# Patient Record
Sex: Female | Born: 1935 | Race: White | Hispanic: No | Marital: Married | Smoking: Never smoker
Health system: Southern US, Community
[De-identification: ages and names within clinical notes are randomized; demographics above are authoritative.]

---

## 1983-12-31 HISTORY — PX: BREAST EXCISIONAL BIOPSY: SUR124

## 1990-12-30 HISTORY — PX: BREAST CYST ASPIRATION: SHX578

## 1998-09-12 ENCOUNTER — Observation Stay (HOSPITAL_COMMUNITY): Admission: RE | Admit: 1998-09-12 | Discharge: 1998-09-13 | Payer: Self-pay | Admitting: Surgery

## 2005-09-05 ENCOUNTER — Ambulatory Visit: Payer: Self-pay | Admitting: Internal Medicine

## 2006-03-13 ENCOUNTER — Ambulatory Visit: Payer: Self-pay | Admitting: Internal Medicine

## 2006-04-25 ENCOUNTER — Ambulatory Visit: Payer: Self-pay | Admitting: Unknown Physician Specialty

## 2006-09-12 ENCOUNTER — Ambulatory Visit: Payer: Self-pay | Admitting: Internal Medicine

## 2006-11-17 ENCOUNTER — Ambulatory Visit: Payer: Self-pay | Admitting: Unknown Physician Specialty

## 2007-01-21 ENCOUNTER — Ambulatory Visit: Payer: Self-pay | Admitting: Internal Medicine

## 2007-09-15 ENCOUNTER — Ambulatory Visit: Payer: Self-pay | Admitting: Internal Medicine

## 2007-12-02 ENCOUNTER — Ambulatory Visit: Payer: Self-pay | Admitting: Unknown Physician Specialty

## 2008-05-09 ENCOUNTER — Other Ambulatory Visit: Payer: Self-pay

## 2008-05-09 ENCOUNTER — Inpatient Hospital Stay: Payer: Self-pay | Admitting: Internal Medicine

## 2008-07-29 ENCOUNTER — Ambulatory Visit: Payer: Self-pay | Admitting: Internal Medicine

## 2008-09-16 ENCOUNTER — Ambulatory Visit: Payer: Self-pay | Admitting: Internal Medicine

## 2009-09-19 ENCOUNTER — Ambulatory Visit: Payer: Self-pay | Admitting: Internal Medicine

## 2010-09-20 ENCOUNTER — Ambulatory Visit: Payer: Self-pay | Admitting: Internal Medicine

## 2011-03-04 ENCOUNTER — Ambulatory Visit: Payer: Self-pay | Admitting: Internal Medicine

## 2011-03-21 ENCOUNTER — Ambulatory Visit: Payer: Self-pay | Admitting: Pain Medicine

## 2011-04-01 ENCOUNTER — Ambulatory Visit: Payer: Self-pay | Admitting: Pain Medicine

## 2011-04-09 ENCOUNTER — Ambulatory Visit: Payer: Self-pay | Admitting: Pain Medicine

## 2011-04-30 ENCOUNTER — Ambulatory Visit: Payer: Self-pay | Admitting: Pain Medicine

## 2011-05-02 ENCOUNTER — Ambulatory Visit: Payer: Self-pay | Admitting: Pain Medicine

## 2011-05-15 ENCOUNTER — Ambulatory Visit: Payer: Self-pay | Admitting: Pain Medicine

## 2011-11-04 ENCOUNTER — Ambulatory Visit: Payer: Self-pay | Admitting: Internal Medicine

## 2012-04-27 IMAGING — MG MM CAD SCREENING MAMMO
1 series · 4 of 4 positions shown · non-contrast
Comparison: 06/25/2002, 06/30/2003, 07/31/2004, 09/15/2007 and 09/19/2009.

REASON FOR EXAM: SCR
COMMENTS:

PROCEDURE:     MAM - MAM DGTL SCREENING MAMMO W/CAD  - September 20, 2010  [DATE]
RESULT:

[Series 7539: R CC · right · 4 of 4 slices shown]
[im 1/4]
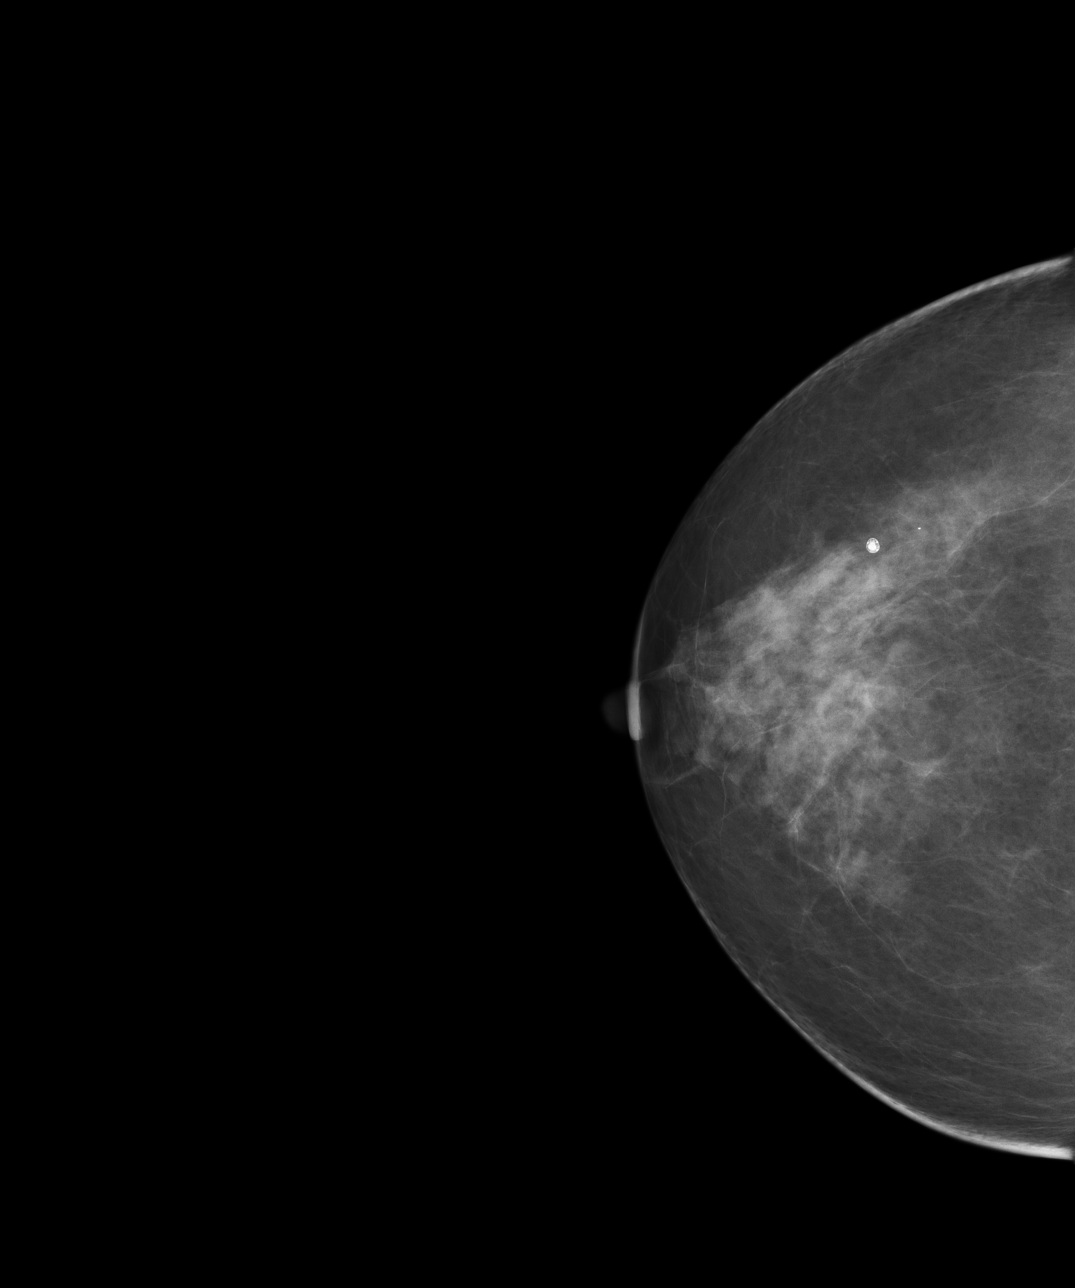
[im 2/4]
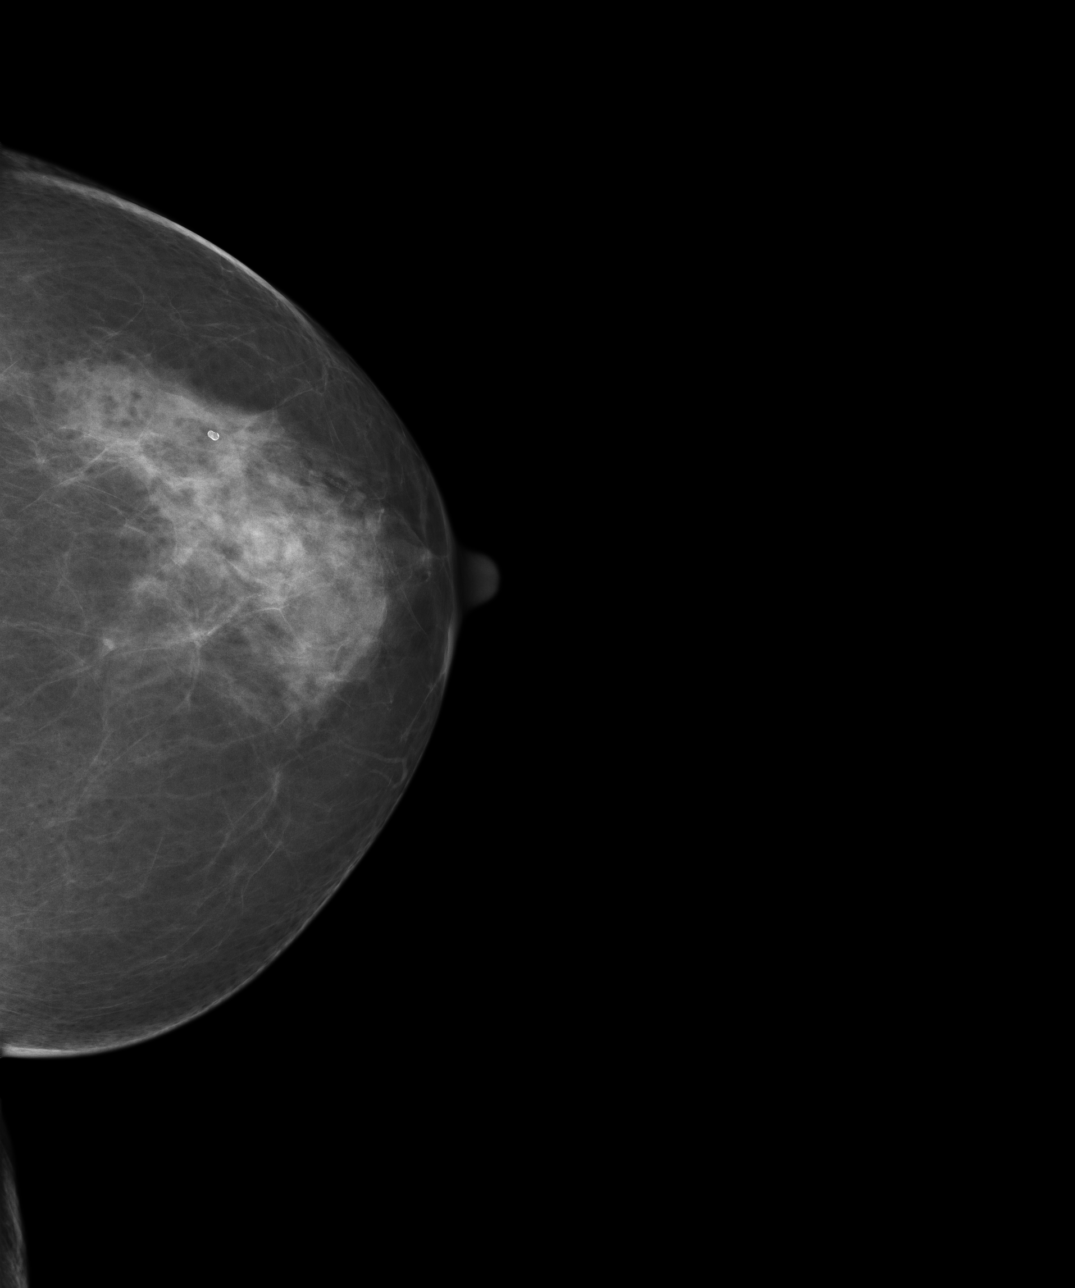
[im 3/4]
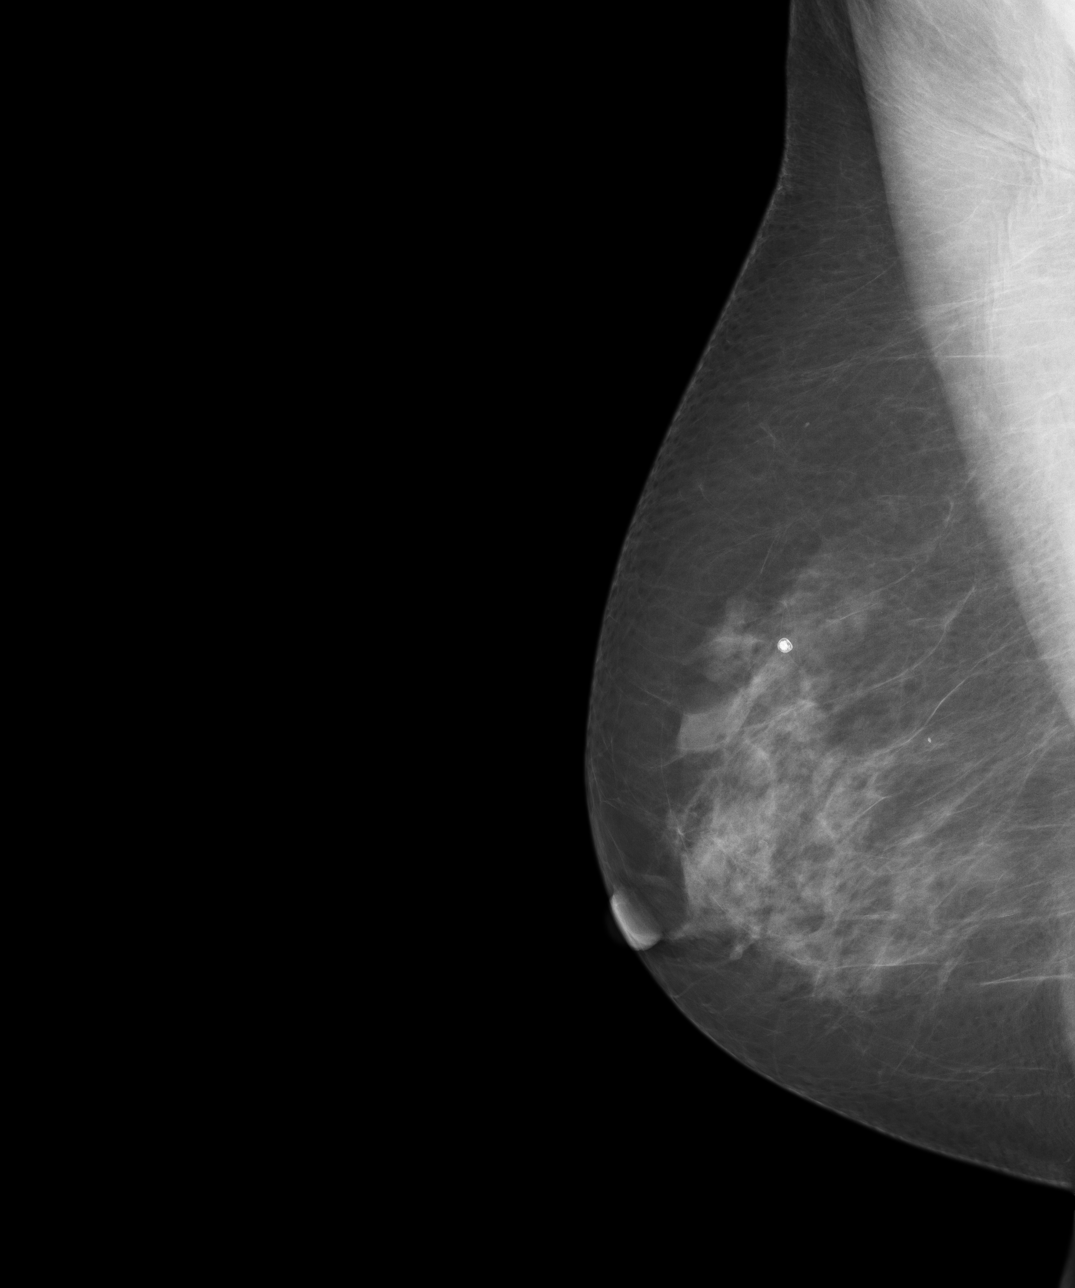
[im 4/4]
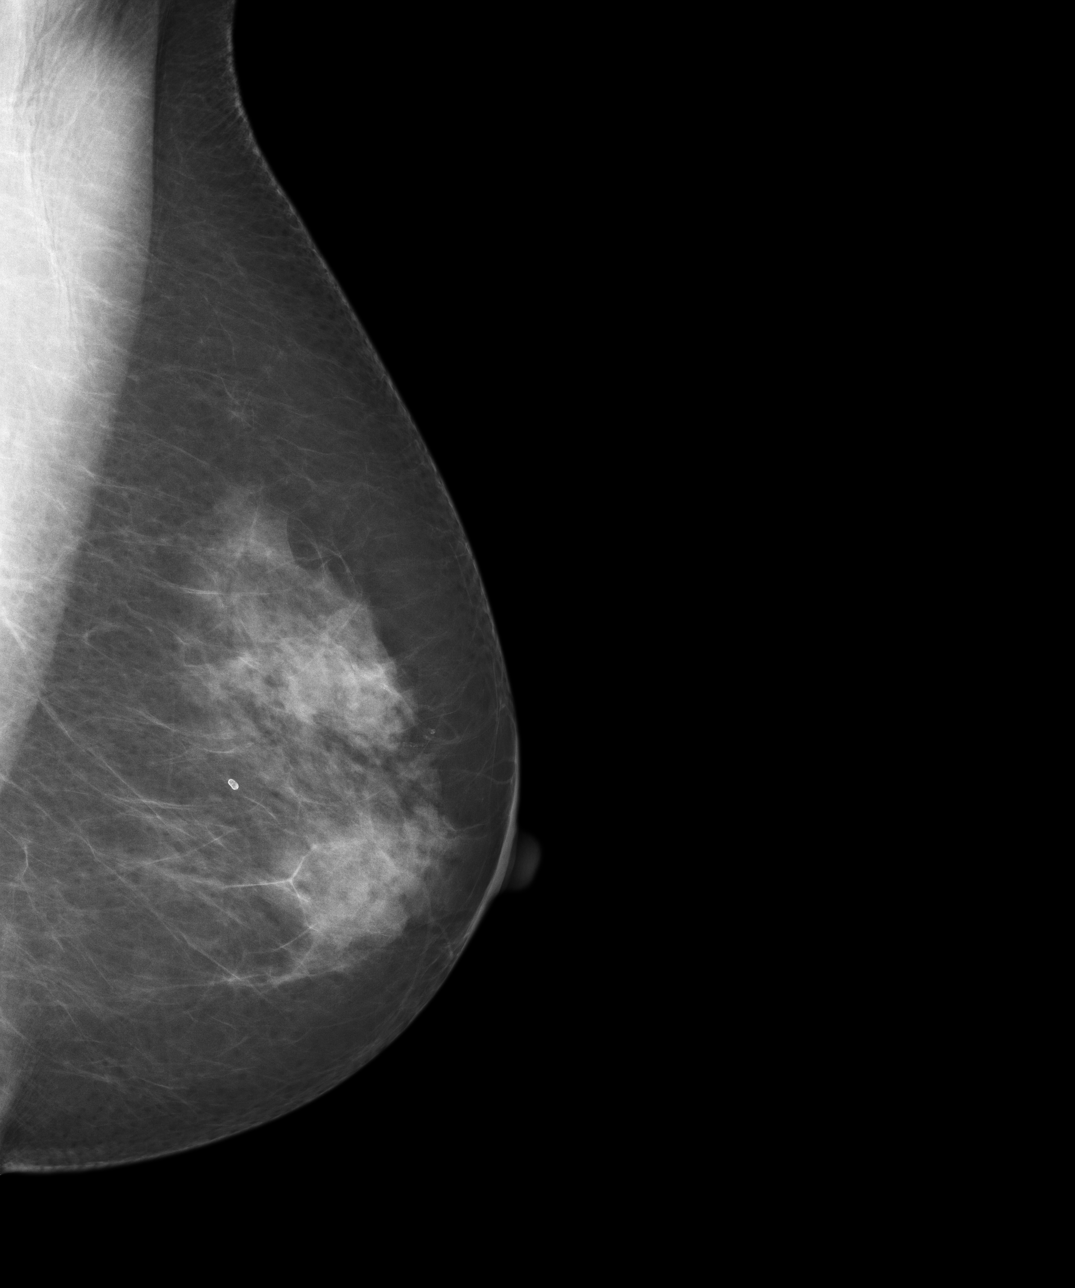

[4 of 4 positions shown; findings below may reference images not displayed]

FINDINGS: Bilateral breasts demonstrate a heterogeneous breast density which
may lower the sensitivity of mammography. There is no new dominant mass,
architectural distortion or clusters of suspicious microcalcifications.
IMPRESSION: 1.      Stable bilateral mammogram.
2.      Annual mammographic follow-up recommended.

BI-RADS: Category 1 - Negative

Thank you for this opportunity to contribute to the care of your patient.

A NEGATIVE MAMMOGRAM REPORT DOES NOT PRECLUDE BIOPSY OR OTHER EVALUATION OF
A CLINICALLY PALPABLE OR OTHERWISE SUSPICIOUS MASS OR LESION. BREAST CANCER
MAY NOT BE DETECTED BY MAMMOGRAPHY IN UP TO 10% OF CASES.

## 2012-11-04 ENCOUNTER — Ambulatory Visit: Payer: Self-pay | Admitting: Internal Medicine

## 2013-11-05 ENCOUNTER — Ambulatory Visit: Payer: Self-pay | Admitting: Internal Medicine

## 2014-11-08 ENCOUNTER — Ambulatory Visit: Payer: Self-pay | Admitting: Internal Medicine

## 2014-11-14 ENCOUNTER — Ambulatory Visit: Payer: Self-pay | Admitting: Unknown Physician Specialty

## 2015-03-12 ENCOUNTER — Emergency Department: Payer: Self-pay | Admitting: Emergency Medicine

## 2015-04-24 LAB — SURGICAL PATHOLOGY

## 2015-09-14 ENCOUNTER — Other Ambulatory Visit: Payer: Self-pay | Admitting: Internal Medicine

## 2015-09-14 DIAGNOSIS — Z1231 Encounter for screening mammogram for malignant neoplasm of breast: Secondary | ICD-10-CM

## 2015-09-14 DIAGNOSIS — E039 Hypothyroidism, unspecified: Secondary | ICD-10-CM | POA: Insufficient documentation

## 2015-09-14 DIAGNOSIS — I38 Endocarditis, valve unspecified: Secondary | ICD-10-CM | POA: Insufficient documentation

## 2015-11-13 ENCOUNTER — Ambulatory Visit
Admission: RE | Admit: 2015-11-13 | Discharge: 2015-11-13 | Disposition: A | Discharge status: Home / Self Care | Payer: Commercial Managed Care - HMO | Source: Ambulatory Visit | Attending: Internal Medicine | Admitting: Internal Medicine

## 2015-11-13 ENCOUNTER — Other Ambulatory Visit: Payer: Self-pay | Admitting: Internal Medicine

## 2015-11-13 DIAGNOSIS — Z1231 Encounter for screening mammogram for malignant neoplasm of breast: Secondary | ICD-10-CM

## 2016-03-06 DIAGNOSIS — Z0000 Encounter for general adult medical examination without abnormal findings: Secondary | ICD-10-CM | POA: Diagnosis not present

## 2016-03-06 DIAGNOSIS — E039 Hypothyroidism, unspecified: Secondary | ICD-10-CM | POA: Diagnosis not present

## 2016-03-07 DIAGNOSIS — M810 Age-related osteoporosis without current pathological fracture: Secondary | ICD-10-CM | POA: Diagnosis not present

## 2016-03-12 DIAGNOSIS — E039 Hypothyroidism, unspecified: Secondary | ICD-10-CM | POA: Diagnosis not present

## 2016-03-12 DIAGNOSIS — Z79899 Other long term (current) drug therapy: Secondary | ICD-10-CM | POA: Diagnosis not present

## 2016-03-12 DIAGNOSIS — M810 Age-related osteoporosis without current pathological fracture: Secondary | ICD-10-CM | POA: Diagnosis not present

## 2016-03-12 DIAGNOSIS — I255 Ischemic cardiomyopathy: Secondary | ICD-10-CM | POA: Diagnosis not present

## 2016-04-08 DIAGNOSIS — M810 Age-related osteoporosis without current pathological fracture: Secondary | ICD-10-CM | POA: Diagnosis not present

## 2016-09-09 DIAGNOSIS — I255 Ischemic cardiomyopathy: Secondary | ICD-10-CM | POA: Diagnosis not present

## 2016-09-09 DIAGNOSIS — E039 Hypothyroidism, unspecified: Secondary | ICD-10-CM | POA: Diagnosis not present

## 2016-09-09 DIAGNOSIS — Z79899 Other long term (current) drug therapy: Secondary | ICD-10-CM | POA: Diagnosis not present

## 2016-09-18 DIAGNOSIS — Z0000 Encounter for general adult medical examination without abnormal findings: Secondary | ICD-10-CM | POA: Diagnosis not present

## 2016-09-18 DIAGNOSIS — I447 Left bundle-branch block, unspecified: Secondary | ICD-10-CM | POA: Insufficient documentation

## 2016-09-18 DIAGNOSIS — Z23 Encounter for immunization: Secondary | ICD-10-CM | POA: Diagnosis not present

## 2016-09-18 DIAGNOSIS — E538 Deficiency of other specified B group vitamins: Secondary | ICD-10-CM | POA: Diagnosis not present

## 2016-09-18 DIAGNOSIS — I38 Endocarditis, valve unspecified: Secondary | ICD-10-CM | POA: Diagnosis not present

## 2016-09-25 DIAGNOSIS — I38 Endocarditis, valve unspecified: Secondary | ICD-10-CM | POA: Diagnosis not present

## 2016-10-07 ENCOUNTER — Other Ambulatory Visit: Payer: Self-pay | Admitting: Internal Medicine

## 2016-10-07 DIAGNOSIS — Z1231 Encounter for screening mammogram for malignant neoplasm of breast: Secondary | ICD-10-CM

## 2016-11-13 ENCOUNTER — Ambulatory Visit
Admission: RE | Admit: 2016-11-13 | Discharge: 2016-11-13 | Disposition: A | Discharge status: Home / Self Care | Payer: PPO | Source: Ambulatory Visit | Attending: Internal Medicine | Admitting: Internal Medicine

## 2016-11-13 DIAGNOSIS — Z1231 Encounter for screening mammogram for malignant neoplasm of breast: Secondary | ICD-10-CM | POA: Diagnosis not present

## 2017-03-11 DIAGNOSIS — Z0000 Encounter for general adult medical examination without abnormal findings: Secondary | ICD-10-CM | POA: Diagnosis not present

## 2017-03-11 DIAGNOSIS — E538 Deficiency of other specified B group vitamins: Secondary | ICD-10-CM | POA: Diagnosis not present

## 2017-03-25 DIAGNOSIS — E538 Deficiency of other specified B group vitamins: Secondary | ICD-10-CM | POA: Diagnosis not present

## 2017-03-25 DIAGNOSIS — I420 Dilated cardiomyopathy: Secondary | ICD-10-CM | POA: Diagnosis not present

## 2017-03-25 DIAGNOSIS — M810 Age-related osteoporosis without current pathological fracture: Secondary | ICD-10-CM | POA: Diagnosis not present

## 2017-03-25 DIAGNOSIS — E039 Hypothyroidism, unspecified: Secondary | ICD-10-CM | POA: Diagnosis not present

## 2017-03-25 DIAGNOSIS — Z0000 Encounter for general adult medical examination without abnormal findings: Secondary | ICD-10-CM | POA: Diagnosis not present

## 2017-04-14 DIAGNOSIS — M810 Age-related osteoporosis without current pathological fracture: Secondary | ICD-10-CM | POA: Diagnosis not present

## 2017-09-05 DIAGNOSIS — H2513 Age-related nuclear cataract, bilateral: Secondary | ICD-10-CM | POA: Diagnosis not present

## 2017-09-18 DIAGNOSIS — M810 Age-related osteoporosis without current pathological fracture: Secondary | ICD-10-CM | POA: Diagnosis not present

## 2017-09-18 DIAGNOSIS — I420 Dilated cardiomyopathy: Secondary | ICD-10-CM | POA: Diagnosis not present

## 2017-09-18 DIAGNOSIS — E538 Deficiency of other specified B group vitamins: Secondary | ICD-10-CM | POA: Diagnosis not present

## 2017-09-18 DIAGNOSIS — I10 Essential (primary) hypertension: Secondary | ICD-10-CM | POA: Diagnosis not present

## 2017-09-18 DIAGNOSIS — E039 Hypothyroidism, unspecified: Secondary | ICD-10-CM | POA: Diagnosis not present

## 2017-09-19 DIAGNOSIS — Z23 Encounter for immunization: Secondary | ICD-10-CM | POA: Diagnosis not present

## 2017-09-25 ENCOUNTER — Ambulatory Visit
Admission: RE | Admit: 2017-09-25 | Discharge: 2017-09-25 | Disposition: A | Discharge status: Home / Self Care | Payer: PPO | Source: Ambulatory Visit | Attending: Internal Medicine | Admitting: Internal Medicine

## 2017-09-25 ENCOUNTER — Other Ambulatory Visit: Payer: Self-pay | Admitting: Internal Medicine

## 2017-09-25 DIAGNOSIS — R1084 Generalized abdominal pain: Secondary | ICD-10-CM

## 2017-09-25 DIAGNOSIS — D259 Leiomyoma of uterus, unspecified: Secondary | ICD-10-CM | POA: Diagnosis not present

## 2017-09-25 DIAGNOSIS — K5730 Diverticulosis of large intestine without perforation or abscess without bleeding: Secondary | ICD-10-CM | POA: Diagnosis not present

## 2017-09-25 DIAGNOSIS — M810 Age-related osteoporosis without current pathological fracture: Secondary | ICD-10-CM | POA: Diagnosis not present

## 2017-09-25 DIAGNOSIS — Z0000 Encounter for general adult medical examination without abnormal findings: Secondary | ICD-10-CM | POA: Diagnosis not present

## 2017-09-25 DIAGNOSIS — N1330 Unspecified hydronephrosis: Secondary | ICD-10-CM | POA: Diagnosis not present

## 2017-09-25 DIAGNOSIS — I2510 Atherosclerotic heart disease of native coronary artery without angina pectoris: Secondary | ICD-10-CM | POA: Diagnosis not present

## 2017-09-25 DIAGNOSIS — E538 Deficiency of other specified B group vitamins: Secondary | ICD-10-CM | POA: Diagnosis not present

## 2017-09-25 DIAGNOSIS — E039 Hypothyroidism, unspecified: Secondary | ICD-10-CM | POA: Diagnosis not present

## 2017-09-25 MED ORDER — IOPAMIDOL (ISOVUE-300) INJECTION 61%
100.0000 mL | Freq: Once | INTRAVENOUS | Status: AC | PRN
  Administered 2017-09-25: 100 mL via INTRAVENOUS

## 2017-10-02 ENCOUNTER — Other Ambulatory Visit: Payer: Self-pay | Admitting: Internal Medicine

## 2017-10-02 DIAGNOSIS — Z1231 Encounter for screening mammogram for malignant neoplasm of breast: Secondary | ICD-10-CM

## 2017-10-06 DIAGNOSIS — M810 Age-related osteoporosis without current pathological fracture: Secondary | ICD-10-CM | POA: Diagnosis not present

## 2017-10-21 DIAGNOSIS — M4180 Other forms of scoliosis, site unspecified: Secondary | ICD-10-CM | POA: Insufficient documentation

## 2017-10-21 DIAGNOSIS — N1330 Unspecified hydronephrosis: Secondary | ICD-10-CM | POA: Diagnosis not present

## 2017-10-21 DIAGNOSIS — E039 Hypothyroidism, unspecified: Secondary | ICD-10-CM | POA: Diagnosis not present

## 2017-10-21 DIAGNOSIS — N816 Rectocele: Secondary | ICD-10-CM | POA: Diagnosis not present

## 2017-10-30 ENCOUNTER — Ambulatory Visit (INDEPENDENT_AMBULATORY_CARE_PROVIDER_SITE_OTHER): Payer: PPO | Admitting: Urology

## 2017-10-30 ENCOUNTER — Encounter: Payer: Self-pay | Admitting: Urology

## 2017-10-30 VITALS — BP 127/68 | HR 86 | Ht 61.0 in | Wt 101.0 lb

## 2017-10-30 DIAGNOSIS — Z86010 Personal history of colonic polyps: Secondary | ICD-10-CM | POA: Insufficient documentation

## 2017-10-30 DIAGNOSIS — N1330 Unspecified hydronephrosis: Secondary | ICD-10-CM

## 2017-10-30 DIAGNOSIS — K8020 Calculus of gallbladder without cholecystitis without obstruction: Secondary | ICD-10-CM | POA: Insufficient documentation

## 2017-10-30 DIAGNOSIS — M542 Cervicalgia: Secondary | ICD-10-CM | POA: Insufficient documentation

## 2017-10-30 DIAGNOSIS — I2510 Atherosclerotic heart disease of native coronary artery without angina pectoris: Secondary | ICD-10-CM | POA: Insufficient documentation

## 2017-10-30 DIAGNOSIS — I10 Essential (primary) hypertension: Secondary | ICD-10-CM | POA: Insufficient documentation

## 2017-10-30 DIAGNOSIS — I429 Cardiomyopathy, unspecified: Secondary | ICD-10-CM | POA: Insufficient documentation

## 2017-10-30 LAB — MICROSCOPIC EXAMINATION
BACTERIA UA: NONE SEEN
EPITHELIAL CELLS (NON RENAL): NONE SEEN /HPF (ref 0–10)
WBC, UA: NONE SEEN /hpf (ref 0–?)

## 2017-10-30 LAB — URINALYSIS, COMPLETE
BILIRUBIN UA: NEGATIVE
Glucose, UA: NEGATIVE
Ketones, UA: NEGATIVE
LEUKOCYTES UA: NEGATIVE
Nitrite, UA: NEGATIVE
PH UA: 7 (ref 5.0–7.5)
PROTEIN UA: NEGATIVE
Specific Gravity, UA: <1.005 — ABNORMAL LOW (ref 1.005–1.030)
Urobilinogen, Ur: 0.2 mg/dL (ref 0.2–1.0)

## 2017-10-30 NOTE — Progress Notes (Signed)
10/30/2017 7:15 PM   Mary Landry [birth year] [id number]  Referring provider: Rusty Aus, MD [facility] [place], [place] [address]  Chief Complaint  Patient presents with  . Hydronephrosis    New Patient    HPI: Mary Landry is a 81 year old female seen in consultation at the request of Dr. Sabra Heck for evaluation of right hydronephrosis.  She saw Dr. Sabra Heck in September 2018 complaining of a left paraspinal mass with pain/tenderness.  A CT of the abdomen and pelvis was performed which showed moderate right hydronephrosis fell consistent with a UPJ obstruction.  A CT scan in 2016 did show hydronephrosis which was slightly less and this was felt secondary to a recently passed stone that was in the bladder.  A renal ultrasound in 2009 showed no hydronephrosis.  She denies right flank or abdominal pain.  She has no voiding symptoms and specifically denies urinary frequency, urgency, dysuria, incontinence or gross hematuria.  Denies previous history of urologic problems.   PMH: No past medical history on file.  Surgical History: Past Surgical History:  Procedure Laterality Date  . BREAST CYST ASPIRATION Right 1992  . BREAST EXCISIONAL BIOPSY Left 1985   benign    Home Medications:  Allergies as of 10/30/2017   No Known Allergies     Medication List       Accurate as of 10/30/17  7:15 PM. Always use your most recent med list.          aspirin EC 81 MG tablet Take by mouth.   levothyroxine 75 MCG tablet Commonly known as:  SYNTHROID, LEVOTHROID take 1 tablet by mouth every morning ON AN EMPTY STOMACH WITH A G...  (REFER TO PRESCRIPTION NOTES).   lisinopril 20 MG tablet Commonly known as:  PRINIVIL,ZESTRIL Take 10 mg by mouth daily.   metoprolol succinate 50 MG 24 hr tablet Commonly known as:  TOPROL-XL Take 25 mg by mouth daily.   omeprazole 20 MG capsule Commonly known as:  PRILOSEC Take 20 mg by mouth daily.     simvastatin 20 MG tablet Commonly known as:  ZOCOR Take 20 mg by mouth at bedtime.       Allergies: No Known Allergies  Family History: No family history on file.  Social History:  reports that she has never smoked. She has never used smokeless tobacco. She reports that she does not drink alcohol or use drugs.  ROS: UROLOGY Frequent Urination?: No Hard to postpone urination?: No Burning/pain with urination?: No Get up at night to urinate?: No Leakage of urine?: No Urine stream starts and stops?: No Trouble starting stream?: No Do you have to strain to urinate?: No Blood in urine?: No Urinary tract infection?: No Sexually transmitted disease?: No Injury to kidneys or bladder?: No Painful intercourse?: No Weak stream?: No Currently pregnant?: No Vaginal bleeding?: No Last menstrual period?: n  Gastrointestinal Nausea?: No Vomiting?: No Indigestion/heartburn?: No Diarrhea?: No Constipation?: No  Constitutional Fever: No Night sweats?: No Weight loss?: No Fatigue?: Yes  Skin Skin rash/lesions?: No Itching?: No  Eyes Blurred vision?: No Double vision?: No  Ears/Nose/Throat Sore throat?: No Sinus problems?: No  Hematologic/Lymphatic Swollen glands?: No Easy bruising?: Yes  Cardiovascular Leg swelling?: No Chest pain?: No  Respiratory Cough?: No Shortness of breath?: No  Endocrine Excessive thirst?: No  Musculoskeletal Back pain?: Yes Joint pain?: No  Neurological Headaches?: No Dizziness?: No  Psychologic Depression?: No Anxiety?: No  Physical Exam: BP 127/68   Pulse  86   Ht 5\' 1"  (1.549 m)   Wt 101 lb (45.8 kg)   BMI 19.08 kg/m   Constitutional:  Alert and oriented, No acute distress. HEENT: [place] AT, moist mucus membranes.  Trachea midline, no masses. Cardiovascular: No clubbing, cyanosis, or edema. Respiratory: Normal respiratory effort, no increased work of breathing. GI: Abdomen is soft, nontender, nondistended, no  abdominal masses GU: No CVA tenderness.  Skin: No rashes, bruises or suspicious lesions. Lymph: No cervical or inguinal adenopathy. Neurologic: Grossly intact, no focal deficits, moving all 4 extremities. Psychiatric: Normal mood and affect.  Laboratory Data:  Urinalysis Lab Results  Component Value Date   SPECGRAV <1.005 (L) 10/30/2017   PHUR 7.0 10/30/2017   COLORU Yellow 10/30/2017   APPEARANCEUR Clear 10/30/2017   LEUKOCYTESUR Negative 10/30/2017   PROTEINUR Negative 10/30/2017   GLUCOSEU Negative 10/30/2017   KETONESU Negative 10/30/2017   RBCU Trace (A) 10/30/2017   BILIRUBINUR Negative 10/30/2017   UUROB 0.2 10/30/2017   NITRITE Negative 10/30/2017    Lab Results  Component Value Date   LABMICR See below: 10/30/2017   WBCUA None seen 10/30/2017   RBCUA 0-2 10/30/2017   LABEPIT None seen 10/30/2017   BACTERIA None seen 10/30/2017    Pertinent Imaging: CT of 09/25/2017 was personally reviewed.  Doubt there is significant hydronephrosis as contrast is present in the renal pelvis and ureter within 10 minutes of contrast injection.  Assessment & Plan:    1. Hydronephrosis, unspecified hydronephrosis type The clinical entity of hydronephrosis was discussed including both obstructive and nonobstructive hydronephrosis.  She does have bilateral extra renal pelves.  Have recommended scheduling a diuretic renogram for further evaluation.  - Urinalysis, Complete - NM Renal Imaging Flow W/Pharm; Future    Abbie Sons, [place] [address], [place], [place] [address] [phone]

## 2017-11-14 ENCOUNTER — Ambulatory Visit
Admission: RE | Admit: 2017-11-14 | Discharge: 2017-11-14 | Disposition: A | Discharge status: Home / Self Care | Payer: PPO | Source: Ambulatory Visit | Attending: Internal Medicine | Admitting: Internal Medicine

## 2017-11-14 DIAGNOSIS — Z1231 Encounter for screening mammogram for malignant neoplasm of breast: Secondary | ICD-10-CM | POA: Diagnosis not present

## 2017-11-24 ENCOUNTER — Encounter
Admission: RE | Admit: 2017-11-24 | Discharge: 2017-11-24 | Disposition: A | Discharge status: Home / Self Care | Payer: PPO | Source: Ambulatory Visit | Attending: Urology | Admitting: Urology

## 2017-11-24 DIAGNOSIS — N1330 Unspecified hydronephrosis: Secondary | ICD-10-CM | POA: Diagnosis not present

## 2017-11-24 MED ORDER — TECHNETIUM TC 99M MERTIATIDE
5.0000 | Freq: Once | INTRAVENOUS | Status: AC | PRN
  Administered 2017-11-24: 5.37 via INTRAVENOUS

## 2017-11-24 MED ORDER — FUROSEMIDE 10 MG/ML IJ SOLN
20.0000 mg | Freq: Once | INTRAMUSCULAR | Status: AC
  Administered 2017-11-24: 20 mg via INTRAVENOUS
  Filled 2017-11-24: qty 2

## 2017-11-25 ENCOUNTER — Telehealth: Payer: Self-pay

## 2017-11-25 NOTE — Telephone Encounter (Signed)
Will send a letter

## 2017-11-25 NOTE — Telephone Encounter (Signed)
-----   Message from Abbie Sons, MD sent at 11/25/2017  7:26 AM EST ----- Renal scan showed no evidence of obstruction.  No further evaluation needed.

## 2017-11-27 ENCOUNTER — Ambulatory Visit (INDEPENDENT_AMBULATORY_CARE_PROVIDER_SITE_OTHER): Payer: PPO | Admitting: Urology

## 2017-11-27 ENCOUNTER — Encounter: Payer: Self-pay | Admitting: Urology

## 2017-11-27 VITALS — BP 109/68 | HR 85 | Ht 61.0 in | Wt 105.0 lb

## 2017-11-27 DIAGNOSIS — N1339 Other hydronephrosis: Secondary | ICD-10-CM

## 2017-11-27 NOTE — Progress Notes (Signed)
11/27/2017 1:45 PM   Marc Morgans Passow Jul 01, 1936 [id number]  Referring provider: Rusty Aus, MD [facility] [place], [place] [address]  Chief Complaint  Patient presents with  . Hydronephrosis    scan results    HPI: 81 year old female presents for follow-up.  She was felt to have moderate right hydronephrosis secondary to UPJ obstruction on a recent CT.  Refer to my previous office note of 10/30/2017.  She has bilateral low back pain but no flank pain.    The renal scan showed normal differential function at 48% and 52%.  T1/2 were normal and there was no evidences of obstruction.   PMH: No past medical history on file.  Surgical History: Past Surgical History:  Procedure Laterality Date  . BREAST CYST ASPIRATION Right 1992  . BREAST EXCISIONAL BIOPSY Left 1985   benign    Home Medications:  Allergies as of 11/27/2017   No Known Allergies     Medication List        Accurate as of 11/27/17  1:45 PM. Always use your most recent med list.          aspirin EC 81 MG tablet Take by mouth.   levothyroxine 75 MCG tablet Commonly known as:  SYNTHROID, LEVOTHROID take 1 tablet by mouth every morning ON AN EMPTY STOMACH WITH A G...  (REFER TO PRESCRIPTION NOTES).   lisinopril 20 MG tablet Commonly known as:  PRINIVIL,ZESTRIL Take 10 mg by mouth daily.   metoprolol succinate 50 MG 24 hr tablet Commonly known as:  TOPROL-XL Take 25 mg by mouth daily.   omeprazole 20 MG capsule Commonly known as:  PRILOSEC Take 20 mg by mouth daily.   simvastatin 20 MG tablet Commonly known as:  ZOCOR Take 20 mg by mouth at bedtime.       Allergies: No Known Allergies  Family History: No family history on file.  Social History:  reports that  has never smoked. she has never used smokeless tobacco. She reports that she does not drink alcohol or use drugs.  ROS: UROLOGY Frequent Urination?: No Hard to postpone  urination?: No Burning/pain with urination?: No Get up at night to urinate?: No Leakage of urine?: No Urine stream starts and stops?: No Trouble starting stream?: No Do you have to strain to urinate?: No Blood in urine?: No Urinary tract infection?: No Sexually transmitted disease?: No Injury to kidneys or bladder?: No Painful intercourse?: No Weak stream?: No Currently pregnant?: No Vaginal bleeding?: No Last menstrual period?: n  Gastrointestinal Nausea?: No Vomiting?: No Indigestion/heartburn?: Yes Diarrhea?: No Constipation?: No  Constitutional Fever: No Night sweats?: No Weight loss?: No Fatigue?: No  Skin Skin rash/lesions?: No Itching?: No  Eyes Blurred vision?: No Double vision?: No  Ears/Nose/Throat Sore throat?: No Sinus problems?: No  Hematologic/Lymphatic Swollen glands?: No Easy bruising?: No  Cardiovascular Leg swelling?: No Chest pain?: No  Respiratory Cough?: No Shortness of breath?: No  Endocrine Excessive thirst?: No  Musculoskeletal Back pain?: Yes Joint pain?: No  Neurological Headaches?: No Dizziness?: No  Psychologic Depression?: No Anxiety?: No  Physical Exam: BP 109/68   Pulse 85   Ht 5\' 1"  (1.549 m)   Wt 105 lb (47.6 kg)   BMI 19.84 kg/m   Constitutional:  Alert and oriented, No acute distress. HEENT: [place] AT, moist mucus membranes.  Trachea midline, no masses. Cardiovascular: No clubbing, cyanosis, or edema. Respiratory: Normal respiratory effort, no increased work of breathing. GI: Abdomen is  soft, nontender, nondistended, no abdominal masses GU: No CVA tenderness. Skin: No rashes, bruises or suspicious lesions. Lymph: No cervical or inguinal adenopathy. Neurologic: Grossly intact, no focal deficits, moving all 4 extremities. Psychiatric: Normal mood and affect.  Laboratory Data:  Urinalysis Lab Results  Component Value Date   SPECGRAV <1.005 (L) 10/30/2017   PHUR 7.0 10/30/2017   COLORU Yellow  10/30/2017   APPEARANCEUR Clear 10/30/2017   LEUKOCYTESUR Negative 10/30/2017   PROTEINUR Negative 10/30/2017   GLUCOSEU Negative 10/30/2017   KETONESU Negative 10/30/2017   RBCU Trace (A) 10/30/2017   BILIRUBINUR Negative 10/30/2017   UUROB 0.2 10/30/2017   NITRITE Negative 10/30/2017    Lab Results  Component Value Date   LABMICR See below: 10/30/2017   WBCUA None seen 10/30/2017   RBCUA 0-2 10/30/2017   LABEPIT None seen 10/30/2017   BACTERIA None seen 10/30/2017    Pertinent Imaging: Renal scan reviewed  Assessment & Plan:   No evidence of obstruction.  Her back pain is most likely musculoskeletal in etiology and would consider an orthopedic referral.  She was instructed to call for development of the right flank pain.   Return if symptoms worsen or fail to improve.    Abbie Sons, [place] [address] [place], [place] [address] [phone]

## 2018-03-19 DIAGNOSIS — E039 Hypothyroidism, unspecified: Secondary | ICD-10-CM | POA: Diagnosis not present

## 2018-03-19 DIAGNOSIS — E538 Deficiency of other specified B group vitamins: Secondary | ICD-10-CM | POA: Diagnosis not present

## 2018-03-19 DIAGNOSIS — I2510 Atherosclerotic heart disease of native coronary artery without angina pectoris: Secondary | ICD-10-CM | POA: Diagnosis not present

## 2018-03-27 DIAGNOSIS — M810 Age-related osteoporosis without current pathological fracture: Secondary | ICD-10-CM | POA: Diagnosis not present

## 2018-03-27 DIAGNOSIS — E538 Deficiency of other specified B group vitamins: Secondary | ICD-10-CM | POA: Diagnosis not present

## 2018-03-27 DIAGNOSIS — Z0000 Encounter for general adult medical examination without abnormal findings: Secondary | ICD-10-CM | POA: Diagnosis not present

## 2018-03-27 DIAGNOSIS — I420 Dilated cardiomyopathy: Secondary | ICD-10-CM | POA: Diagnosis not present

## 2018-03-27 DIAGNOSIS — E039 Hypothyroidism, unspecified: Secondary | ICD-10-CM | POA: Diagnosis not present

## 2018-03-27 DIAGNOSIS — E782 Mixed hyperlipidemia: Secondary | ICD-10-CM | POA: Diagnosis not present

## 2018-04-14 DIAGNOSIS — I420 Dilated cardiomyopathy: Secondary | ICD-10-CM | POA: Diagnosis not present

## 2018-06-04 DIAGNOSIS — M810 Age-related osteoporosis without current pathological fracture: Secondary | ICD-10-CM | POA: Diagnosis not present

## 2018-09-22 DIAGNOSIS — M810 Age-related osteoporosis without current pathological fracture: Secondary | ICD-10-CM | POA: Diagnosis not present

## 2018-09-22 DIAGNOSIS — I420 Dilated cardiomyopathy: Secondary | ICD-10-CM | POA: Diagnosis not present

## 2018-09-22 DIAGNOSIS — E782 Mixed hyperlipidemia: Secondary | ICD-10-CM | POA: Diagnosis not present

## 2018-09-22 DIAGNOSIS — E039 Hypothyroidism, unspecified: Secondary | ICD-10-CM | POA: Diagnosis not present

## 2018-09-22 DIAGNOSIS — E538 Deficiency of other specified B group vitamins: Secondary | ICD-10-CM | POA: Diagnosis not present

## 2018-09-29 DIAGNOSIS — E782 Mixed hyperlipidemia: Secondary | ICD-10-CM | POA: Diagnosis not present

## 2018-09-29 DIAGNOSIS — Z0000 Encounter for general adult medical examination without abnormal findings: Secondary | ICD-10-CM | POA: Diagnosis not present

## 2018-09-29 DIAGNOSIS — E039 Hypothyroidism, unspecified: Secondary | ICD-10-CM | POA: Diagnosis not present

## 2018-09-29 DIAGNOSIS — E538 Deficiency of other specified B group vitamins: Secondary | ICD-10-CM | POA: Diagnosis not present

## 2018-09-29 DIAGNOSIS — I38 Endocarditis, valve unspecified: Secondary | ICD-10-CM | POA: Diagnosis not present

## 2018-09-29 DIAGNOSIS — Z23 Encounter for immunization: Secondary | ICD-10-CM | POA: Diagnosis not present

## 2018-09-30 ENCOUNTER — Other Ambulatory Visit: Payer: Self-pay | Admitting: Internal Medicine

## 2018-09-30 DIAGNOSIS — Z1231 Encounter for screening mammogram for malignant neoplasm of breast: Secondary | ICD-10-CM

## 2018-11-16 ENCOUNTER — Ambulatory Visit
Admission: RE | Admit: 2018-11-16 | Discharge: 2018-11-16 | Disposition: A | Discharge status: Home / Self Care | Payer: PPO | Source: Ambulatory Visit | Attending: Internal Medicine | Admitting: Internal Medicine

## 2018-11-16 DIAGNOSIS — Z1231 Encounter for screening mammogram for malignant neoplasm of breast: Secondary | ICD-10-CM | POA: Insufficient documentation

## 2019-03-25 DIAGNOSIS — E782 Mixed hyperlipidemia: Secondary | ICD-10-CM | POA: Diagnosis not present

## 2019-03-25 DIAGNOSIS — E538 Deficiency of other specified B group vitamins: Secondary | ICD-10-CM | POA: Diagnosis not present

## 2019-03-25 DIAGNOSIS — E039 Hypothyroidism, unspecified: Secondary | ICD-10-CM | POA: Diagnosis not present

## 2019-04-01 DIAGNOSIS — N401 Enlarged prostate with lower urinary tract symptoms: Secondary | ICD-10-CM | POA: Diagnosis not present

## 2019-04-01 DIAGNOSIS — I420 Dilated cardiomyopathy: Secondary | ICD-10-CM | POA: Diagnosis not present

## 2019-04-01 DIAGNOSIS — R350 Frequency of micturition: Secondary | ICD-10-CM | POA: Diagnosis not present

## 2019-09-17 DIAGNOSIS — Z23 Encounter for immunization: Secondary | ICD-10-CM | POA: Diagnosis not present

## 2019-09-28 DIAGNOSIS — M810 Age-related osteoporosis without current pathological fracture: Secondary | ICD-10-CM | POA: Diagnosis not present

## 2019-09-28 DIAGNOSIS — E538 Deficiency of other specified B group vitamins: Secondary | ICD-10-CM | POA: Diagnosis not present

## 2019-09-28 DIAGNOSIS — E782 Mixed hyperlipidemia: Secondary | ICD-10-CM | POA: Diagnosis not present

## 2019-10-06 DIAGNOSIS — M810 Age-related osteoporosis without current pathological fracture: Secondary | ICD-10-CM | POA: Diagnosis not present

## 2019-10-06 DIAGNOSIS — E538 Deficiency of other specified B group vitamins: Secondary | ICD-10-CM | POA: Diagnosis not present

## 2019-10-06 DIAGNOSIS — I420 Dilated cardiomyopathy: Secondary | ICD-10-CM | POA: Diagnosis not present

## 2019-10-06 DIAGNOSIS — E782 Mixed hyperlipidemia: Secondary | ICD-10-CM | POA: Diagnosis not present

## 2019-10-06 DIAGNOSIS — Z0000 Encounter for general adult medical examination without abnormal findings: Secondary | ICD-10-CM | POA: Diagnosis not present

## 2019-10-11 ENCOUNTER — Other Ambulatory Visit: Payer: Self-pay | Admitting: Internal Medicine

## 2019-10-11 DIAGNOSIS — Z1231 Encounter for screening mammogram for malignant neoplasm of breast: Secondary | ICD-10-CM

## 2019-10-12 DIAGNOSIS — M810 Age-related osteoporosis without current pathological fracture: Secondary | ICD-10-CM | POA: Diagnosis not present

## 2019-11-18 ENCOUNTER — Ambulatory Visit
Admission: RE | Admit: 2019-11-18 | Discharge: 2019-11-18 | Disposition: A | Discharge status: Home / Self Care | Payer: PPO | Source: Ambulatory Visit | Attending: Internal Medicine | Admitting: Internal Medicine

## 2019-11-18 DIAGNOSIS — Z1231 Encounter for screening mammogram for malignant neoplasm of breast: Secondary | ICD-10-CM | POA: Diagnosis not present

## 2020-03-29 DIAGNOSIS — E538 Deficiency of other specified B group vitamins: Secondary | ICD-10-CM | POA: Diagnosis not present

## 2020-03-29 DIAGNOSIS — I420 Dilated cardiomyopathy: Secondary | ICD-10-CM | POA: Diagnosis not present

## 2020-03-29 DIAGNOSIS — E782 Mixed hyperlipidemia: Secondary | ICD-10-CM | POA: Diagnosis not present

## 2020-04-05 DIAGNOSIS — Z0000 Encounter for general adult medical examination without abnormal findings: Secondary | ICD-10-CM | POA: Diagnosis not present

## 2020-04-05 DIAGNOSIS — I420 Dilated cardiomyopathy: Secondary | ICD-10-CM | POA: Diagnosis not present

## 2020-04-05 DIAGNOSIS — M810 Age-related osteoporosis without current pathological fracture: Secondary | ICD-10-CM | POA: Diagnosis not present

## 2020-04-05 DIAGNOSIS — E538 Deficiency of other specified B group vitamins: Secondary | ICD-10-CM | POA: Diagnosis not present

## 2020-04-05 DIAGNOSIS — I38 Endocarditis, valve unspecified: Secondary | ICD-10-CM | POA: Diagnosis not present

## 2020-04-05 DIAGNOSIS — E782 Mixed hyperlipidemia: Secondary | ICD-10-CM | POA: Diagnosis not present

## 2020-04-21 DIAGNOSIS — I38 Endocarditis, valve unspecified: Secondary | ICD-10-CM | POA: Diagnosis not present

## 2020-06-16 DIAGNOSIS — M5441 Lumbago with sciatica, right side: Secondary | ICD-10-CM | POA: Diagnosis not present

## 2020-10-02 DIAGNOSIS — E782 Mixed hyperlipidemia: Secondary | ICD-10-CM | POA: Diagnosis not present

## 2020-10-02 DIAGNOSIS — E538 Deficiency of other specified B group vitamins: Secondary | ICD-10-CM | POA: Diagnosis not present

## 2020-10-09 DIAGNOSIS — Z0000 Encounter for general adult medical examination without abnormal findings: Secondary | ICD-10-CM | POA: Diagnosis not present

## 2020-10-09 DIAGNOSIS — Z23 Encounter for immunization: Secondary | ICD-10-CM | POA: Diagnosis not present

## 2020-10-09 DIAGNOSIS — E782 Mixed hyperlipidemia: Secondary | ICD-10-CM | POA: Diagnosis not present

## 2020-10-09 DIAGNOSIS — E538 Deficiency of other specified B group vitamins: Secondary | ICD-10-CM | POA: Diagnosis not present

## 2020-10-09 DIAGNOSIS — I255 Ischemic cardiomyopathy: Secondary | ICD-10-CM | POA: Diagnosis not present

## 2020-10-16 ENCOUNTER — Ambulatory Visit: Payer: PPO | Attending: Internal Medicine

## 2020-10-16 DIAGNOSIS — Z23 Encounter for immunization: Secondary | ICD-10-CM

## 2020-10-16 NOTE — Progress Notes (Signed)
   Covid-19 Vaccination Clinic  Name:  Mary Landry    MRN: [id number] DOB: 02/21/1936  10/16/2020  Mary Landry was observed post Covid-19 immunization for 15 minutes without incident. She was provided with Vaccine Information Sheet and instruction to access the V-Safe system.   Mary Landry was instructed to call 911 with any severe reactions post vaccine: Marland Kitchen Difficulty breathing  . Swelling of face and throat  . A fast heartbeat  . A bad rash all over body  . Dizziness and weakness

## 2020-10-17 ENCOUNTER — Other Ambulatory Visit: Payer: Self-pay | Admitting: Internal Medicine

## 2020-10-17 DIAGNOSIS — S51811A Laceration without foreign body of right forearm, initial encounter: Secondary | ICD-10-CM | POA: Diagnosis not present

## 2020-10-17 DIAGNOSIS — S50812A Abrasion of left forearm, initial encounter: Secondary | ICD-10-CM | POA: Diagnosis not present

## 2020-10-17 DIAGNOSIS — Z1231 Encounter for screening mammogram for malignant neoplasm of breast: Secondary | ICD-10-CM

## 2020-12-04 ENCOUNTER — Ambulatory Visit
Admission: RE | Admit: 2020-12-04 | Discharge: 2020-12-04 | Disposition: A | Discharge status: Home / Self Care | Payer: PPO | Source: Ambulatory Visit | Attending: Internal Medicine | Admitting: Internal Medicine

## 2020-12-04 ENCOUNTER — Other Ambulatory Visit: Payer: Self-pay

## 2020-12-04 DIAGNOSIS — Z1231 Encounter for screening mammogram for malignant neoplasm of breast: Secondary | ICD-10-CM | POA: Diagnosis not present

## 2021-04-05 DIAGNOSIS — E782 Mixed hyperlipidemia: Secondary | ICD-10-CM | POA: Diagnosis not present

## 2021-04-09 DIAGNOSIS — E039 Hypothyroidism, unspecified: Secondary | ICD-10-CM | POA: Diagnosis not present

## 2021-04-09 DIAGNOSIS — I420 Dilated cardiomyopathy: Secondary | ICD-10-CM | POA: Diagnosis not present

## 2021-04-09 DIAGNOSIS — Z0000 Encounter for general adult medical examination without abnormal findings: Secondary | ICD-10-CM | POA: Diagnosis not present

## 2021-04-09 DIAGNOSIS — E782 Mixed hyperlipidemia: Secondary | ICD-10-CM | POA: Diagnosis not present

## 2021-04-09 DIAGNOSIS — M810 Age-related osteoporosis without current pathological fracture: Secondary | ICD-10-CM | POA: Diagnosis not present

## 2021-04-09 DIAGNOSIS — E538 Deficiency of other specified B group vitamins: Secondary | ICD-10-CM | POA: Diagnosis not present

## 2021-04-18 DIAGNOSIS — H2513 Age-related nuclear cataract, bilateral: Secondary | ICD-10-CM | POA: Diagnosis not present

## 2021-07-09 DIAGNOSIS — E039 Hypothyroidism, unspecified: Secondary | ICD-10-CM | POA: Diagnosis not present

## 2021-08-03 DIAGNOSIS — I519 Heart disease, unspecified: Secondary | ICD-10-CM | POA: Diagnosis not present

## 2021-08-03 DIAGNOSIS — S81811A Laceration without foreign body, right lower leg, initial encounter: Secondary | ICD-10-CM | POA: Diagnosis not present

## 2021-08-13 DIAGNOSIS — S81811D Laceration without foreign body, right lower leg, subsequent encounter: Secondary | ICD-10-CM | POA: Diagnosis not present

## 2021-08-13 DIAGNOSIS — Z4802 Encounter for removal of sutures: Secondary | ICD-10-CM | POA: Diagnosis not present

## 2021-10-01 DIAGNOSIS — S81802A Unspecified open wound, left lower leg, initial encounter: Secondary | ICD-10-CM | POA: Diagnosis not present

## 2021-10-01 DIAGNOSIS — L089 Local infection of the skin and subcutaneous tissue, unspecified: Secondary | ICD-10-CM | POA: Diagnosis not present

## 2021-10-05 DIAGNOSIS — E039 Hypothyroidism, unspecified: Secondary | ICD-10-CM | POA: Diagnosis not present

## 2021-10-05 DIAGNOSIS — M810 Age-related osteoporosis without current pathological fracture: Secondary | ICD-10-CM | POA: Diagnosis not present

## 2021-10-05 DIAGNOSIS — E538 Deficiency of other specified B group vitamins: Secondary | ICD-10-CM | POA: Diagnosis not present

## 2021-10-05 DIAGNOSIS — E782 Mixed hyperlipidemia: Secondary | ICD-10-CM | POA: Diagnosis not present

## 2021-10-22 DIAGNOSIS — Z0000 Encounter for general adult medical examination without abnormal findings: Secondary | ICD-10-CM | POA: Diagnosis not present

## 2021-10-22 DIAGNOSIS — Z23 Encounter for immunization: Secondary | ICD-10-CM | POA: Diagnosis not present

## 2021-10-22 DIAGNOSIS — E538 Deficiency of other specified B group vitamins: Secondary | ICD-10-CM | POA: Diagnosis not present

## 2021-10-22 DIAGNOSIS — E782 Mixed hyperlipidemia: Secondary | ICD-10-CM | POA: Diagnosis not present

## 2021-10-22 DIAGNOSIS — I420 Dilated cardiomyopathy: Secondary | ICD-10-CM | POA: Diagnosis not present

## 2021-11-07 ENCOUNTER — Other Ambulatory Visit: Payer: Self-pay | Admitting: Internal Medicine

## 2021-11-07 DIAGNOSIS — Z1231 Encounter for screening mammogram for malignant neoplasm of breast: Secondary | ICD-10-CM

## 2021-12-05 ENCOUNTER — Other Ambulatory Visit: Payer: Self-pay

## 2021-12-05 ENCOUNTER — Ambulatory Visit
Admission: RE | Admit: 2021-12-05 | Discharge: 2021-12-05 | Disposition: A | Discharge status: Home / Self Care | Payer: PPO | Source: Ambulatory Visit | Attending: Internal Medicine | Admitting: Internal Medicine

## 2021-12-05 DIAGNOSIS — Z1231 Encounter for screening mammogram for malignant neoplasm of breast: Secondary | ICD-10-CM | POA: Insufficient documentation

## 2022-03-28 DIAGNOSIS — E782 Mixed hyperlipidemia: Secondary | ICD-10-CM | POA: Diagnosis not present

## 2022-03-28 DIAGNOSIS — E538 Deficiency of other specified B group vitamins: Secondary | ICD-10-CM | POA: Diagnosis not present

## 2022-04-04 DIAGNOSIS — I420 Dilated cardiomyopathy: Secondary | ICD-10-CM | POA: Diagnosis not present

## 2022-04-04 DIAGNOSIS — E538 Deficiency of other specified B group vitamins: Secondary | ICD-10-CM | POA: Diagnosis not present

## 2022-04-04 DIAGNOSIS — Z0000 Encounter for general adult medical examination without abnormal findings: Secondary | ICD-10-CM | POA: Diagnosis not present

## 2022-04-04 DIAGNOSIS — E782 Mixed hyperlipidemia: Secondary | ICD-10-CM | POA: Diagnosis not present

## 2022-05-08 ENCOUNTER — Encounter: Payer: Self-pay | Admitting: Emergency Medicine

## 2022-05-08 ENCOUNTER — Other Ambulatory Visit: Payer: Self-pay

## 2022-05-08 ENCOUNTER — Emergency Department
Admission: EM | Admit: 2022-05-08 | Discharge: 2022-05-08 | Disposition: A | Discharge status: Home / Self Care | Payer: PPO | Attending: Emergency Medicine | Admitting: Emergency Medicine

## 2022-05-08 DIAGNOSIS — W2203XA Walked into furniture, initial encounter: Secondary | ICD-10-CM | POA: Diagnosis not present

## 2022-05-08 DIAGNOSIS — S8991XA Unspecified injury of right lower leg, initial encounter: Secondary | ICD-10-CM | POA: Diagnosis present

## 2022-05-08 DIAGNOSIS — Z23 Encounter for immunization: Secondary | ICD-10-CM | POA: Insufficient documentation

## 2022-05-08 DIAGNOSIS — S81811A Laceration without foreign body, right lower leg, initial encounter: Secondary | ICD-10-CM | POA: Diagnosis not present

## 2022-05-08 MED ORDER — TETANUS-DIPHTH-ACELL PERTUSSIS 5-2.5-18.5 LF-MCG/0.5 IM SUSY
0.5000 mL | PREFILLED_SYRINGE | Freq: Once | INTRAMUSCULAR | Status: AC
  Administered 2022-05-08: 0.5 mL via INTRAMUSCULAR
  Filled 2022-05-08: qty 0.5

## 2022-05-08 MED ORDER — CEPHALEXIN 500 MG PO CAPS: 1000.0000 mg | ORAL_CAPSULE | Freq: Two times a day (BID) | ORAL | 0 refills | Status: AC

## 2022-05-08 NOTE — ED Provider Notes (Signed)
? ?[facility] ?Provider Note ? ?Patient Contact: 5:35 PM (approximate) ? ? ?History  ? ?Laceration ? ? ?HPI ? ?Mary Landry is a 86 y.o. female who presents to the emergency department for evaluation of a laceration to the right shin.  Patient was helping her husband take a dresser upstairs when it slipped and make contact with her leg.  She sustained a laceration along the anterior aspect of the shin.  She went to urgent care and they referred her to the ED for evaluation.  Patient has bleeding under control.  Denies any other injury or complaint. Last tetanus greater than 5 years ago ?  ? ? ?Physical Exam  ? ?Triage Vital Signs: ?ED Triage Vitals  ?Enc Vitals Group  ?   BP 05/08/22 1626 134/70  ?   Pulse Rate 05/08/22 1626 90  ?   Resp 05/08/22 1626 16  ?   Temp 05/08/22 1626 98 ?F (36.7 ?C)  ?   Temp Source 05/08/22 1626 Oral  ?   SpO2 05/08/22 1626 99 %  ?   Weight 05/08/22 1617 104 lb 15 oz (47.6 kg)  ?   Height 05/08/22 1617 '5\' 1"'$  (1.549 m)  ?   Head Circumference --   ?   Peak Flow --   ?   Pain Score 05/08/22 1617 0  ?   Pain Loc --   ?   Pain Edu? --   ?   Excl. in [place]? --   ? ? ?Most recent vital signs: ?Vitals:  ? 05/08/22 1626  ?BP: 134/70  ?Pulse: 90  ?Resp: 16  ?Temp: 98 ?F (36.7 ?C)  ?SpO2: 99%  ? ? ? ?General: Alert and in no acute distress.  ?Cardiovascular:  Good peripheral perfusion ?Respiratory: Normal respiratory effort without tachypnea or retractions. Lungs CTAB.  ?Musculoskeletal: Full range of motion to all extremities.  Patient with visible laceration along the right anterior shin.  The distal aspect just superior to the ankle joint.  This laceration measures approximately 12 cm in length.  Patient has very thin skin, there is no extension of the laceration through the subcutaneous tissue, it appears that dermal and epidermal tissues are primarily involved.  However skin is extremely fragile at this time. ?Neurologic:  No gross focal neurologic deficits are  appreciated.  ?Skin:   No rash noted ?Other: ? ? ?ED Results / Procedures / Treatments  ? ?Labs ?(all labs ordered are listed, but only abnormal results are displayed) ?Labs Reviewed - No data to display ? ? ?EKG ? ? ? ? ?RADIOLOGY ? ? ? ?No results found. ? ?PROCEDURES: ? ?Critical Care performed: No ? ?Marland Kitchen.Laceration Repair ? ?Date/Time: 05/08/2022 6:38 PM ?Performed by: Darletta Moll, PA-C ?Authorized by: Darletta Moll, PA-C  ? ?Consent:  ?  Consent obtained:  Verbal ?  Consent given by:  Patient ?  Risks discussed:  Infection, pain, poor wound healing and need for additional repair ?Universal protocol:  ?  Procedure explained and questions answered to patient or proxy's satisfaction: yes   ?  Patient identity confirmed:  Verbally with patient ?Anesthesia:  ?  Anesthesia method:  None ?Laceration details:  ?  Location:  Leg ?  Leg location:  R lower leg ?  Length (cm):  12 ?Exploration:  ?  Limited defect created (wound extended): no   ?  Hemostasis achieved with:  Direct pressure ?  Imaging outcome: foreign body not noted   ?  Wound exploration:  wound explored through full range of motion and entire depth of wound visualized   ?  Wound extent: no foreign bodies/material noted   ?  Contaminated: no   ?Treatment:  ?  Area cleansed with:  Shur-Clens ?  Amount of cleaning:  Standard ?Skin repair:  ?  Repair method: dermaclips. ?Approximation:  ?  Approximation:  Close ?Repair type:  ?  Repair type:  Simple ?Post-procedure details:  ?  Dressing:  Tube gauze ?  Procedure completion:  Tolerated well, no immediate complications ? ? ?MEDICATIONS ORDERED IN ED: ?Medications  ?Tdap (BOOSTRIX) injection 0.5 mL (has no administration in time range)  ? ? ? ?IMPRESSION / MDM / ASSESSMENT AND PLAN / ED COURSE  ?I reviewed the triage vital signs and the nursing notes. ?             ?               ? ?Differential diagnosis includes, but is not limited to, skin tear, laceration, retained foreign body, vascular  injury ? ? ?Patient's diagnosis is consistent with laceration to the right lower extremity.  Patient states that she was trying to move a piece of furniture when it caught the lower portion of her leg.  Laceration was noted.  Patient has very thin skin, states that in the past she has tried to have sutures placed in her skin tears to further.  On evaluation it does appear that well laceration would benefit from sutures, I was concerned that the skin would not be able to hold the sutures.  As such we use derma clips to reapproximate the wound which did well with close reapproximation.  Patient does not have any tearing at this time.  I discussed wound care instructions at length with the patient.  Patient's leg is wrapped at this time to prevent any disruption of the derma clips..  Follow-up with primary care as needed.  Patient is given ED precautions to return to the ED for any worsening or new symptoms. ? ? ? ?  ? ? ?FINAL CLINICAL IMPRESSION(S) / ED DIAGNOSES  ? ?Final diagnoses:  ?Laceration of right lower extremity, initial encounter  ? ? ? ?Rx / DC Orders  ? ?ED Discharge Orders   ? ?      Ordered  ?  cephALEXin (KEFLEX) 500 MG capsule  2 times daily       ? 05/08/22 1846  ? ?  ?  ? ?  ? ? ? ?Note:  This document was prepared using Dragon voice recognition software and may include unintentional dictation errors. ?  ?Darletta Moll, PA-C ?05/08/22 1846 ? ?  ?Rada Hay, MD ?05/08/22 1933 ? ?

## 2022-05-08 NOTE — ED Triage Notes (Signed)
C/O right lower leg laceration. Cut it on a piece of furniture while moving it today.  Sent to ED by [facility]. ?

## 2022-05-17 DIAGNOSIS — L03115 Cellulitis of right lower limb: Secondary | ICD-10-CM | POA: Diagnosis not present

## 2022-05-17 DIAGNOSIS — Z9181 History of falling: Secondary | ICD-10-CM | POA: Diagnosis not present

## 2022-05-23 DIAGNOSIS — S81811D Laceration without foreign body, right lower leg, subsequent encounter: Secondary | ICD-10-CM | POA: Diagnosis not present

## 2022-10-16 DIAGNOSIS — E782 Mixed hyperlipidemia: Secondary | ICD-10-CM | POA: Diagnosis not present

## 2022-10-16 DIAGNOSIS — E538 Deficiency of other specified B group vitamins: Secondary | ICD-10-CM | POA: Diagnosis not present

## 2022-10-23 DIAGNOSIS — Z0000 Encounter for general adult medical examination without abnormal findings: Secondary | ICD-10-CM | POA: Diagnosis not present

## 2022-10-23 DIAGNOSIS — E782 Mixed hyperlipidemia: Secondary | ICD-10-CM | POA: Diagnosis not present

## 2022-10-23 DIAGNOSIS — E538 Deficiency of other specified B group vitamins: Secondary | ICD-10-CM | POA: Diagnosis not present

## 2022-10-23 DIAGNOSIS — Z23 Encounter for immunization: Secondary | ICD-10-CM | POA: Diagnosis not present

## 2022-10-23 DIAGNOSIS — I420 Dilated cardiomyopathy: Secondary | ICD-10-CM | POA: Diagnosis not present

## 2022-10-23 DIAGNOSIS — M5116 Intervertebral disc disorders with radiculopathy, lumbar region: Secondary | ICD-10-CM | POA: Diagnosis not present

## 2022-10-23 DIAGNOSIS — M8588 Other specified disorders of bone density and structure, other site: Secondary | ICD-10-CM | POA: Diagnosis not present

## 2022-10-31 ENCOUNTER — Other Ambulatory Visit: Payer: Self-pay | Admitting: Internal Medicine

## 2022-10-31 DIAGNOSIS — Z1231 Encounter for screening mammogram for malignant neoplasm of breast: Secondary | ICD-10-CM

## 2022-12-06 ENCOUNTER — Ambulatory Visit
Admission: RE | Admit: 2022-12-06 | Discharge: 2022-12-06 | Disposition: A | Discharge status: Home / Self Care | Payer: PPO | Source: Ambulatory Visit | Attending: Internal Medicine | Admitting: Internal Medicine

## 2022-12-06 DIAGNOSIS — Z1231 Encounter for screening mammogram for malignant neoplasm of breast: Secondary | ICD-10-CM | POA: Diagnosis not present

## 2023-04-17 DIAGNOSIS — E538 Deficiency of other specified B group vitamins: Secondary | ICD-10-CM | POA: Diagnosis not present

## 2023-04-17 DIAGNOSIS — E782 Mixed hyperlipidemia: Secondary | ICD-10-CM | POA: Diagnosis not present

## 2023-05-02 DIAGNOSIS — M810 Age-related osteoporosis without current pathological fracture: Secondary | ICD-10-CM | POA: Diagnosis not present

## 2023-05-02 DIAGNOSIS — I420 Dilated cardiomyopathy: Secondary | ICD-10-CM | POA: Diagnosis not present

## 2023-05-02 DIAGNOSIS — R739 Hyperglycemia, unspecified: Secondary | ICD-10-CM | POA: Diagnosis not present

## 2023-05-02 DIAGNOSIS — E538 Deficiency of other specified B group vitamins: Secondary | ICD-10-CM | POA: Diagnosis not present

## 2023-05-02 DIAGNOSIS — E039 Hypothyroidism, unspecified: Secondary | ICD-10-CM | POA: Diagnosis not present

## 2023-05-02 DIAGNOSIS — E782 Mixed hyperlipidemia: Secondary | ICD-10-CM | POA: Diagnosis not present

## 2023-05-02 DIAGNOSIS — Z0000 Encounter for general adult medical examination without abnormal findings: Secondary | ICD-10-CM | POA: Diagnosis not present

## 2023-07-13 IMAGING — MG MM DIGITAL SCREENING BILAT W/ TOMO AND CAD
6 of 10 series · 6 of 30 positions shown · non-contrast
Comparison: Previous exam(s).

CLINICAL DATA: Screening.

EXAM:
DIGITAL SCREENING BILATERAL MAMMOGRAM WITH TOMOSYNTHESIS AND CAD
TECHNIQUE: Bilateral screening digital craniocaudal and mediolateral oblique
mammograms were obtained. Bilateral screening digital breast
tomosynthesis was performed. The images were evaluated with
computer-aided detection.

[R CC synth-2D]
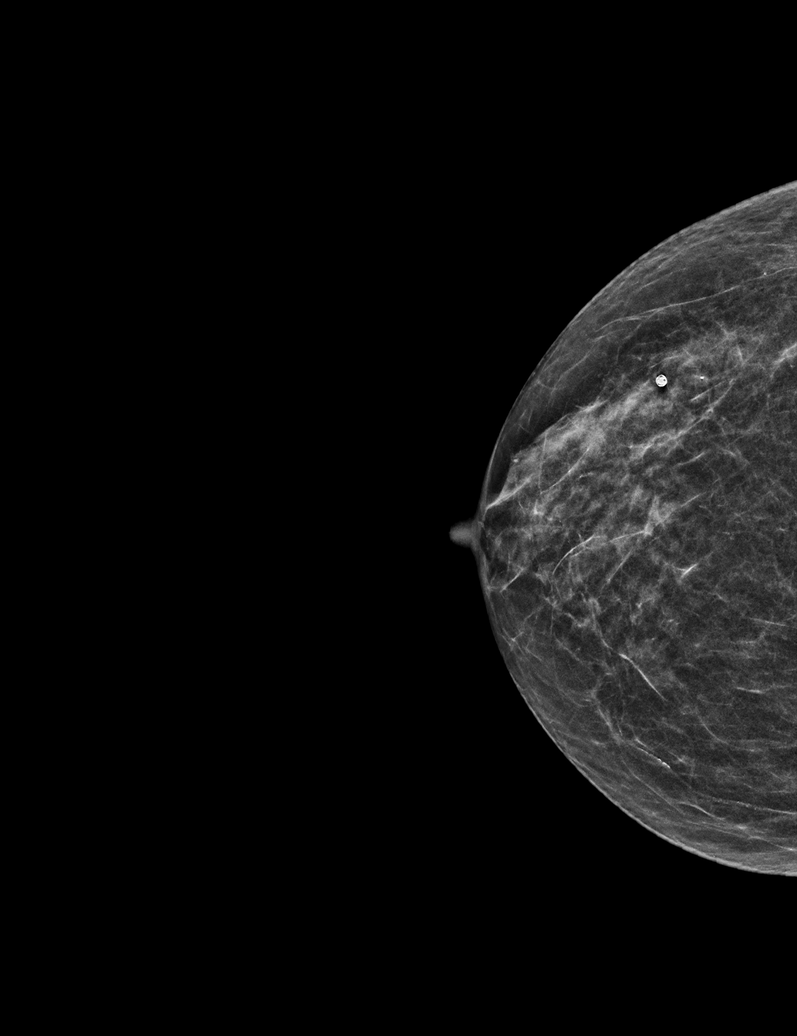

[L CC synth-2D]
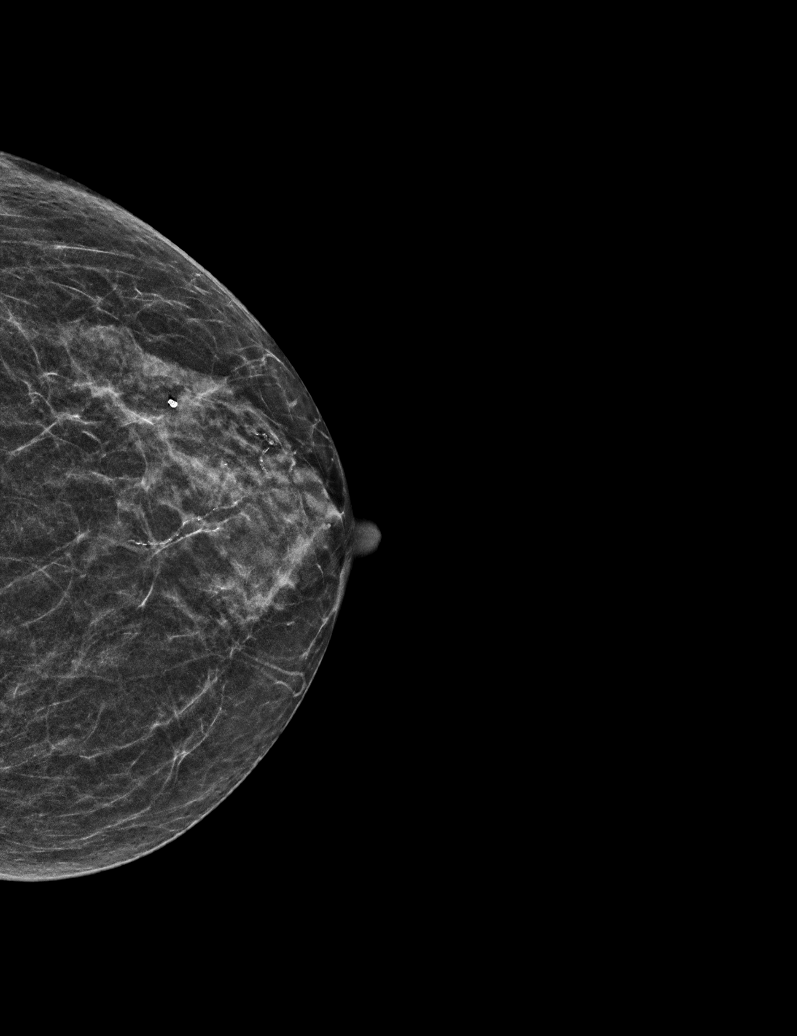

[R MLO synth-2D]
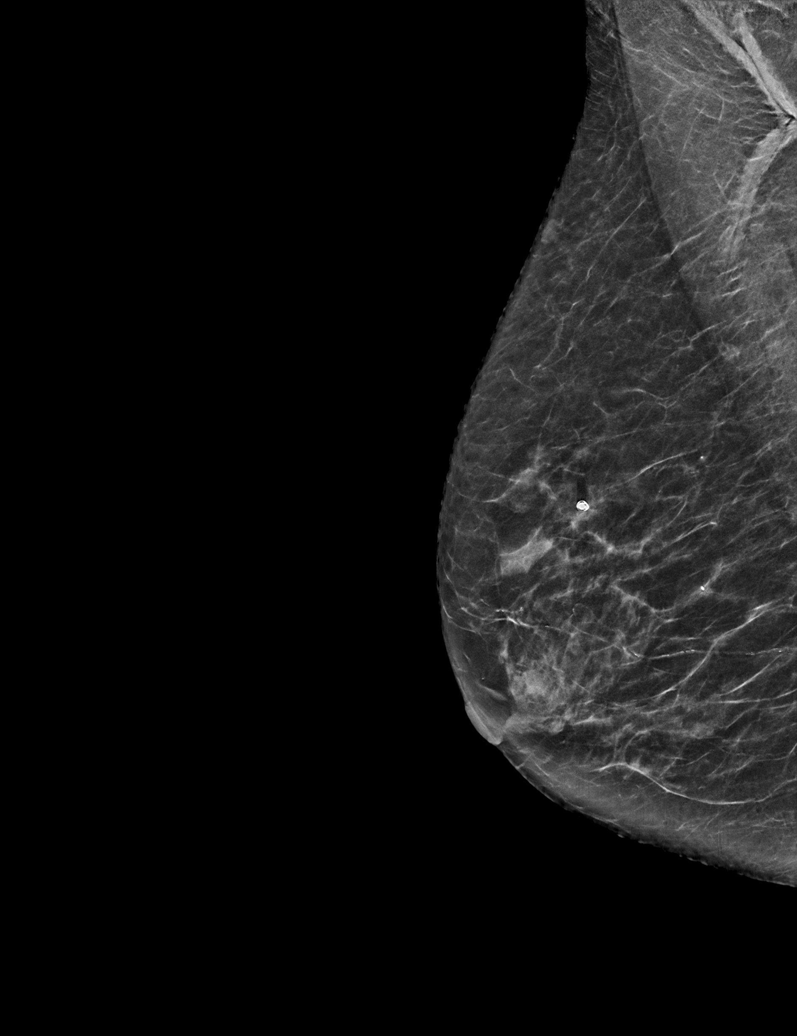

[L MLO synth-2D (1 of 2)]
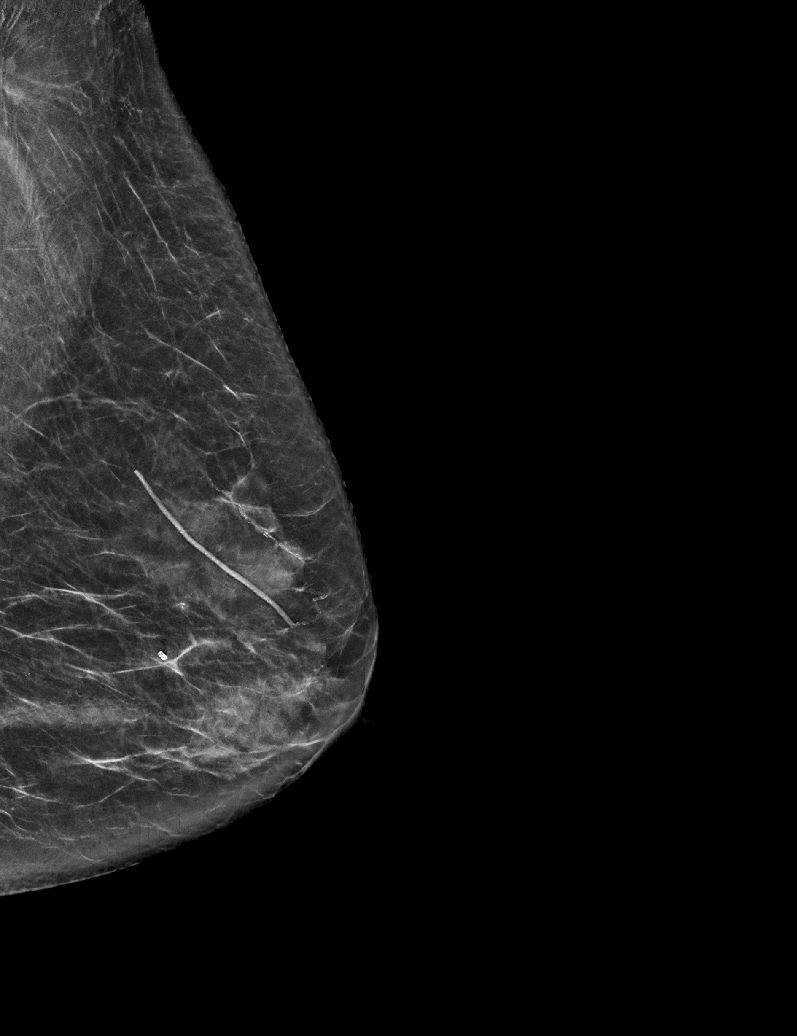

[L MLO synth-2D (2 of 2)]
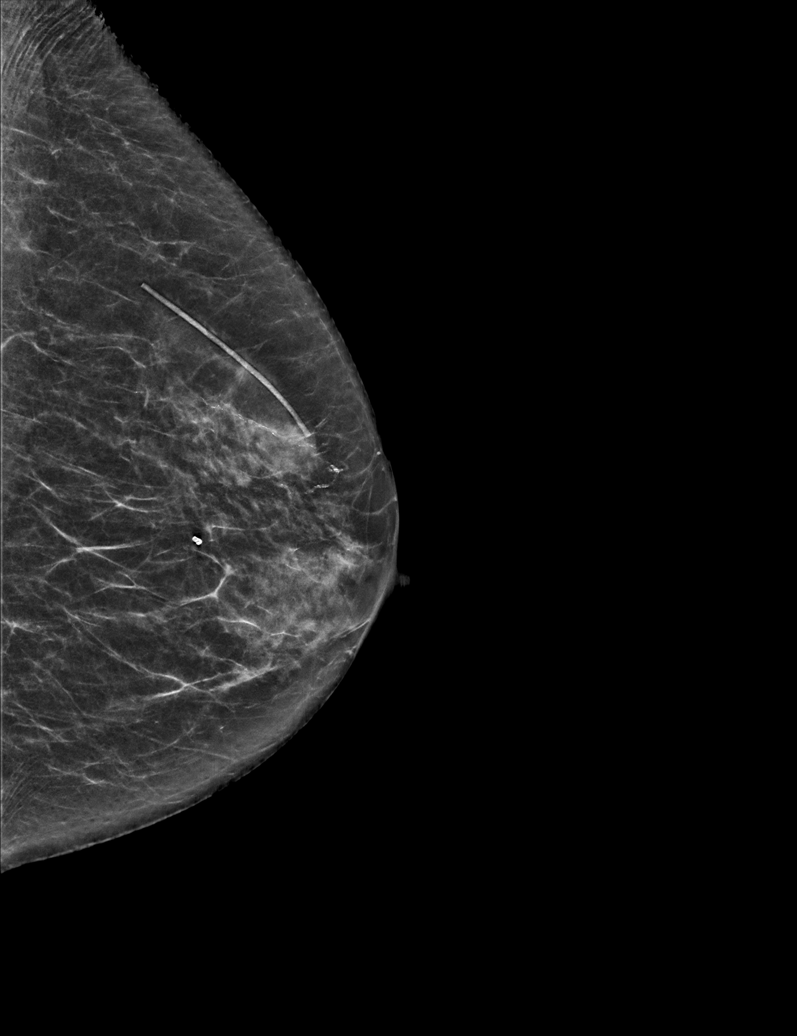

[L CC tomo · tomo slice 17/34.0]
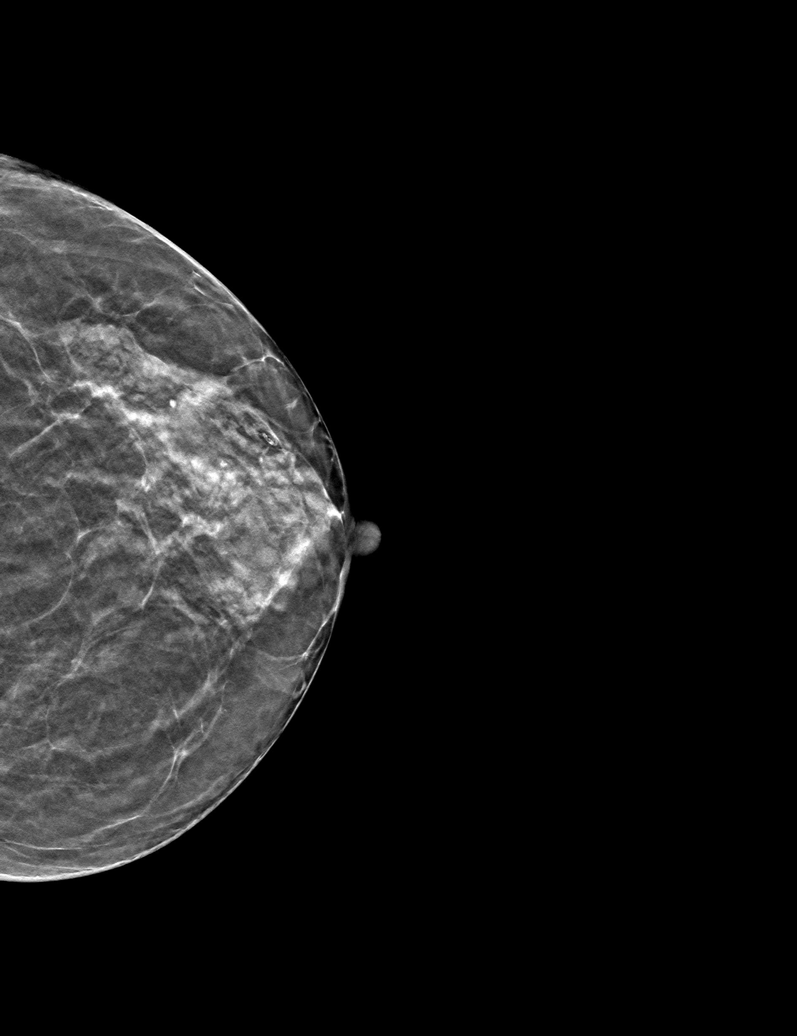

[6 of 30 positions shown; findings below may reference images not displayed]

ACR Breast Density Category b: There are scattered areas of
fibroglandular density.
FINDINGS: There are no findings suspicious for malignancy.
IMPRESSION: No mammographic evidence of malignancy. A result letter of this
screening mammogram will be mailed directly to the patient.

RECOMMENDATION:
Screening mammogram in one year. (Code:51-O-LD2)

BI-RADS CATEGORY  1: Negative.

## 2023-07-30 DIAGNOSIS — J208 Acute bronchitis due to other specified organisms: Secondary | ICD-10-CM | POA: Diagnosis not present

## 2023-07-30 DIAGNOSIS — U071 COVID-19: Secondary | ICD-10-CM | POA: Diagnosis not present

## 2023-07-30 DIAGNOSIS — J45902 Unspecified asthma with status asthmaticus: Secondary | ICD-10-CM | POA: Diagnosis not present

## 2023-08-27 DIAGNOSIS — R21 Rash and other nonspecific skin eruption: Secondary | ICD-10-CM | POA: Diagnosis not present

## 2023-10-02 DIAGNOSIS — H353131 Nonexudative age-related macular degeneration, bilateral, early dry stage: Secondary | ICD-10-CM | POA: Diagnosis not present

## 2023-10-27 DIAGNOSIS — M810 Age-related osteoporosis without current pathological fracture: Secondary | ICD-10-CM | POA: Diagnosis not present

## 2023-10-27 DIAGNOSIS — E538 Deficiency of other specified B group vitamins: Secondary | ICD-10-CM | POA: Diagnosis not present

## 2023-10-27 DIAGNOSIS — R739 Hyperglycemia, unspecified: Secondary | ICD-10-CM | POA: Diagnosis not present

## 2023-10-27 DIAGNOSIS — E782 Mixed hyperlipidemia: Secondary | ICD-10-CM | POA: Diagnosis not present

## 2023-11-03 DIAGNOSIS — I420 Dilated cardiomyopathy: Secondary | ICD-10-CM | POA: Diagnosis not present

## 2023-11-03 DIAGNOSIS — Z23 Encounter for immunization: Secondary | ICD-10-CM | POA: Diagnosis not present

## 2023-11-03 DIAGNOSIS — E782 Mixed hyperlipidemia: Secondary | ICD-10-CM | POA: Diagnosis not present

## 2023-11-03 DIAGNOSIS — Z0000 Encounter for general adult medical examination without abnormal findings: Secondary | ICD-10-CM | POA: Diagnosis not present

## 2023-11-03 DIAGNOSIS — R222 Localized swelling, mass and lump, trunk: Secondary | ICD-10-CM | POA: Diagnosis not present

## 2023-11-03 DIAGNOSIS — E538 Deficiency of other specified B group vitamins: Secondary | ICD-10-CM | POA: Diagnosis not present

## 2023-11-05 ENCOUNTER — Other Ambulatory Visit: Payer: Self-pay | Admitting: Internal Medicine

## 2023-11-05 DIAGNOSIS — R222 Localized swelling, mass and lump, trunk: Secondary | ICD-10-CM

## 2023-11-05 DIAGNOSIS — Z0000 Encounter for general adult medical examination without abnormal findings: Secondary | ICD-10-CM

## 2023-11-07 ENCOUNTER — Ambulatory Visit
Admission: RE | Admit: 2023-11-07 | Discharge: 2023-11-07 | Disposition: A | Discharge status: Home / Self Care | Payer: PPO | Source: Ambulatory Visit | Attending: Internal Medicine | Admitting: Internal Medicine

## 2023-11-07 DIAGNOSIS — R9341 Abnormal radiologic findings on diagnostic imaging of renal pelvis, ureter, or bladder: Secondary | ICD-10-CM | POA: Insufficient documentation

## 2023-11-07 DIAGNOSIS — R222 Localized swelling, mass and lump, trunk: Secondary | ICD-10-CM | POA: Diagnosis not present

## 2023-11-07 DIAGNOSIS — Z0000 Encounter for general adult medical examination without abnormal findings: Secondary | ICD-10-CM | POA: Diagnosis present

## 2023-11-07 DIAGNOSIS — Z9049 Acquired absence of other specified parts of digestive tract: Secondary | ICD-10-CM | POA: Diagnosis not present

## 2023-11-07 DIAGNOSIS — M47815 Spondylosis without myelopathy or radiculopathy, thoracolumbar region: Secondary | ICD-10-CM | POA: Insufficient documentation

## 2023-11-07 DIAGNOSIS — M419 Scoliosis, unspecified: Secondary | ICD-10-CM | POA: Insufficient documentation

## 2023-11-07 DIAGNOSIS — D259 Leiomyoma of uterus, unspecified: Secondary | ICD-10-CM | POA: Insufficient documentation

## 2023-11-07 DIAGNOSIS — R1900 Intra-abdominal and pelvic swelling, mass and lump, unspecified site: Secondary | ICD-10-CM | POA: Diagnosis not present

## 2023-11-07 MED ORDER — IOHEXOL 300 MG/ML  SOLN
75.0000 mL | Freq: Once | INTRAMUSCULAR | Status: AC | PRN
  Administered 2023-11-07: 75 mL via INTRAVENOUS

## 2023-11-13 ENCOUNTER — Other Ambulatory Visit: Payer: Self-pay | Admitting: Internal Medicine

## 2023-11-13 DIAGNOSIS — Z1231 Encounter for screening mammogram for malignant neoplasm of breast: Secondary | ICD-10-CM

## 2023-12-09 ENCOUNTER — Ambulatory Visit
Admission: RE | Admit: 2023-12-09 | Discharge: 2023-12-09 | Disposition: A | Discharge status: Home / Self Care | Payer: PPO | Source: Ambulatory Visit | Attending: Internal Medicine | Admitting: Internal Medicine

## 2023-12-09 DIAGNOSIS — Z1231 Encounter for screening mammogram for malignant neoplasm of breast: Secondary | ICD-10-CM | POA: Insufficient documentation

## 2023-12-22 DIAGNOSIS — J22 Unspecified acute lower respiratory infection: Secondary | ICD-10-CM | POA: Diagnosis not present

## 2024-02-11 DIAGNOSIS — H9201 Otalgia, right ear: Secondary | ICD-10-CM | POA: Diagnosis not present

## 2024-02-11 DIAGNOSIS — M26621 Arthralgia of right temporomandibular joint: Secondary | ICD-10-CM | POA: Diagnosis not present

## 2024-04-26 DIAGNOSIS — E782 Mixed hyperlipidemia: Secondary | ICD-10-CM | POA: Diagnosis not present

## 2024-04-26 DIAGNOSIS — E538 Deficiency of other specified B group vitamins: Secondary | ICD-10-CM | POA: Diagnosis not present

## 2024-05-03 DIAGNOSIS — Z0000 Encounter for general adult medical examination without abnormal findings: Secondary | ICD-10-CM | POA: Diagnosis not present

## 2024-05-03 DIAGNOSIS — I420 Dilated cardiomyopathy: Secondary | ICD-10-CM | POA: Diagnosis not present

## 2024-05-03 DIAGNOSIS — E538 Deficiency of other specified B group vitamins: Secondary | ICD-10-CM | POA: Diagnosis not present

## 2024-05-03 DIAGNOSIS — E782 Mixed hyperlipidemia: Secondary | ICD-10-CM | POA: Diagnosis not present

## 2024-05-03 DIAGNOSIS — R739 Hyperglycemia, unspecified: Secondary | ICD-10-CM | POA: Diagnosis not present

## 2024-10-27 DIAGNOSIS — E538 Deficiency of other specified B group vitamins: Secondary | ICD-10-CM | POA: Diagnosis not present

## 2024-10-27 DIAGNOSIS — R739 Hyperglycemia, unspecified: Secondary | ICD-10-CM | POA: Diagnosis not present

## 2024-10-27 DIAGNOSIS — E782 Mixed hyperlipidemia: Secondary | ICD-10-CM | POA: Diagnosis not present

## 2024-11-02 ENCOUNTER — Other Ambulatory Visit: Payer: Self-pay | Admitting: Internal Medicine

## 2024-11-02 DIAGNOSIS — Z1231 Encounter for screening mammogram for malignant neoplasm of breast: Secondary | ICD-10-CM

## 2024-11-03 DIAGNOSIS — Z23 Encounter for immunization: Secondary | ICD-10-CM | POA: Diagnosis not present

## 2024-11-03 DIAGNOSIS — I10 Essential (primary) hypertension: Secondary | ICD-10-CM | POA: Diagnosis not present

## 2024-11-03 DIAGNOSIS — E538 Deficiency of other specified B group vitamins: Secondary | ICD-10-CM | POA: Diagnosis not present

## 2024-11-03 DIAGNOSIS — E782 Mixed hyperlipidemia: Secondary | ICD-10-CM | POA: Diagnosis not present

## 2024-11-03 DIAGNOSIS — Z0000 Encounter for general adult medical examination without abnormal findings: Secondary | ICD-10-CM | POA: Diagnosis not present

## 2024-11-03 DIAGNOSIS — Z79899 Other long term (current) drug therapy: Secondary | ICD-10-CM | POA: Diagnosis not present

## 2024-12-09 ENCOUNTER — Ambulatory Visit
Admission: RE | Admit: 2024-12-09 | Discharge: 2024-12-09 | Disposition: A | Discharge status: Home / Self Care | Source: Ambulatory Visit | Attending: Internal Medicine | Admitting: Internal Medicine

## 2024-12-09 DIAGNOSIS — Z1231 Encounter for screening mammogram for malignant neoplasm of breast: Secondary | ICD-10-CM | POA: Insufficient documentation
